# Patient Record
Sex: Female | Born: 2000 | Race: White | Hispanic: No | Marital: Single | Smoking: Never smoker
Health system: Southern US, Community
[De-identification: ages and names within clinical notes are randomized; demographics above are authoritative.]

## PROBLEM LIST (undated history)

## (undated) DIAGNOSIS — F419 Anxiety disorder, unspecified: Secondary | ICD-10-CM

## (undated) HISTORY — DX: Anxiety disorder, unspecified: F41.9

---
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
## 2019-06-13 ENCOUNTER — Other Ambulatory Visit: Payer: Self-pay

## 2019-06-13 DIAGNOSIS — Z20822 Contact with and (suspected) exposure to covid-19: Secondary | ICD-10-CM

## 2019-06-13 DIAGNOSIS — R6889 Other general symptoms and signs: Secondary | ICD-10-CM | POA: Diagnosis not present

## 2019-06-15 LAB — NOVEL CORONAVIRUS, NAA: SARS-CoV-2, NAA: NOT DETECTED

## 2020-02-08 ENCOUNTER — Telehealth: Payer: Self-pay | Admitting: Pediatrics

## 2020-02-08 NOTE — Telephone Encounter (Signed)
Pre-screening for onsite visit  1. Who is bringing the patient to the visit? Self  Informed only one adult can bring patient to the visit to limit possible exposure to COVID19 and facemasks must be worn while in the building by the patient (ages 2 and older) and adult.  2. Has the person bringing the patient or the patient been around anyone with suspected or confirmed COVID-19 in the last 14 days? No   3. Has the person bringing the patient or the patient been around anyone who has been tested for COVID-19 in the last 14 days? No  4. Has the person bringing the patient or the patient had any of these symptoms in the last 14 days? No   Fever (temp 100 F or higher) Breathing problems Cough Sore throat Body aches Chills Vomiting Diarrhea Loss of taste or smell   If all answers are negative, advise patient to call our office prior to your appointment if you or the patient develop any of the symptoms listed above.   If any answers are yes, cancel in-office visit and schedule the patient for a same day telehealth visit with a provider to discuss the next steps.  

## 2020-02-11 ENCOUNTER — Other Ambulatory Visit (HOSPITAL_COMMUNITY)
Admission: RE | Admit: 2020-02-11 | Discharge: 2020-02-11 | Disposition: A | Discharge status: Home / Self Care | Payer: Medicaid Other | Source: Ambulatory Visit | Attending: Pediatrics | Admitting: Pediatrics

## 2020-02-11 ENCOUNTER — Ambulatory Visit (INDEPENDENT_AMBULATORY_CARE_PROVIDER_SITE_OTHER): Payer: Medicaid Other | Admitting: Pediatrics

## 2020-02-11 ENCOUNTER — Other Ambulatory Visit: Payer: Self-pay

## 2020-02-11 ENCOUNTER — Encounter: Payer: Self-pay | Admitting: Pediatrics

## 2020-02-11 VITALS — BP 110/68 | HR 92 | Ht 62.09 in | Wt 129.2 lb

## 2020-02-11 DIAGNOSIS — Z68.41 Body mass index (BMI) pediatric, 5th percentile to less than 85th percentile for age: Secondary | ICD-10-CM | POA: Diagnosis not present

## 2020-02-11 DIAGNOSIS — Z113 Encounter for screening for infections with a predominantly sexual mode of transmission: Secondary | ICD-10-CM

## 2020-02-11 DIAGNOSIS — B351 Tinea unguium: Secondary | ICD-10-CM | POA: Diagnosis not present

## 2020-02-11 DIAGNOSIS — Z0000 Encounter for general adult medical examination without abnormal findings: Secondary | ICD-10-CM

## 2020-02-11 DIAGNOSIS — Z3009 Encounter for other general counseling and advice on contraception: Secondary | ICD-10-CM | POA: Diagnosis not present

## 2020-02-11 LAB — POCT URINALYSIS DIPSTICK
Bilirubin, UA: NORMAL
Blood, UA: NEGATIVE
Glucose, UA: NEGATIVE
Ketones, UA: NEGATIVE
Leukocytes, UA: NEGATIVE
Protein, UA: NEGATIVE
Spec Grav, UA: 1.01 (ref 1.010–1.025)
Urobilinogen, UA: 0.2 E.U./dL
pH, UA: 6 (ref 5.0–8.0)

## 2020-02-11 LAB — CBC WITH DIFFERENTIAL/PLATELET
Absolute Monocytes: 488 cells/uL (ref 200–950)
Basophils Absolute: 18 cells/uL (ref 0–200)
Basophils Relative: 0.3 %
Eosinophils Absolute: 128 cells/uL (ref 15–500)
Eosinophils Relative: 2.1 %
HCT: 37.6 % (ref 35.0–45.0)
Hemoglobin: 12.6 g/dL (ref 11.7–15.5)
Lymphs Abs: 2410 cells/uL (ref 850–3900)
MCH: 30.6 pg (ref 27.0–33.0)
MCHC: 33.5 g/dL (ref 32.0–36.0)
MCV: 91.3 fL (ref 80.0–100.0)
MPV: 11 fL (ref 7.5–12.5)
Monocytes Relative: 8 %
Neutro Abs: 3056 cells/uL (ref 1500–7800)
Neutrophils Relative %: 50.1 %
Platelets: 260 10*3/uL (ref 140–400)
RBC: 4.12 10*6/uL (ref 3.80–5.10)
RDW: 11.8 % (ref 11.0–15.0)
Total Lymphocyte: 39.5 %
WBC: 6.1 10*3/uL (ref 3.8–10.8)

## 2020-02-11 LAB — POCT RAPID HIV: Rapid HIV, POC: NEGATIVE

## 2020-02-11 MED ORDER — TERBINAFINE HCL 250 MG PO TABS: 250.0000 mg | ORAL_TABLET | Freq: Every day | ORAL | 0 refills | Status: DC

## 2020-02-11 NOTE — Progress Notes (Signed)
Adolescent Well Care Visit Eileen Phillips is a 19 y.o. female who is here for well care.    PCP:  Ok Edwards, MD   History was provided by the patient and mother.  Confidentiality was discussed with the patient and, if applicable, with caregiver as well. Patient's personal or confidential phone number: [phone]   Current Issues: Current concerns include:  Chief Complaint  Patient presents with  . Well Child    needs form filled out to start college; vaccine records  . Foot Problem    Left big toe; some kind of fungus for about a year; no pain  . Contraception    mom wants to discuss birth control options   Left toenail discoloration has been ongoing for the past 1 yr. Used some OTC topical meds.  Nutrition: Nutrition/Eating Behaviors: eats a variety of foods Adequate calcium in diet?: yes Supplements/ Vitamins: no  Exercise/ Media: Play any Sports?/ Exercise: no Screen Time:  > 2 hours-counseling provided Media Rules or Monitoring?: no  Sleep:  Sleep: no issues.   Social Screening: Lives with: mom & 3 sibs  Parental relations:  good Activities, Work, and Research officer, political party?: waitresses  Concerns regarding behavior with peers?  no Stressors of note: no  Education: School Name: [employer] . Plans to start [employer] this fall. Major in Database administrator.  School performance: doing well; no concerns   Menstruation:   Patient's last menstrual period was 01/29/2020 (approximate). Menstrual History: regular cycles    Confidential Social History: Tobacco?  no Secondhand smoke exposure?  no Drugs/ETOH?  no  Sexually Active?  No. Has a boyfriend but not planning to become sexually active. Mom however has been discussing birth control with her. Pregnancy Prevention: abstinence  Safe at home, in school & in relationships?  Yes Safe to self?  Yes   Screenings: Patient has a dental home: yes  The patient completed the Rapid Assessment for Adolescent Preventive Services  screening questionnaire and the following topics were identified as risk factors and discussed: healthy eating, exercise, tobacco use, marijuana use, drug use, condom use, mental health issues and screen time    PHQ-9 completed and results indicated: negative screen  Physical Exam:  Vitals:   02/11/20 1447  BP: 110/68  Pulse: 92  Weight: 129 lb 3.2 oz (58.6 kg)  Height: 5' 2.09" (1.577 m)   BP 110/68   Pulse 92   Ht 5' 2.09" (1.577 m)   Wt 129 lb 3.2 oz (58.6 kg)   LMP 01/29/2020 (Approximate)   BMI 23.56 kg/m  Body mass index: body mass index is 23.56 kg/m. Blood pressure percentiles are not available for patients who are 18 years or older.   Hearing Screening   125Hz  250Hz  500Hz  1000Hz  2000Hz  3000Hz  4000Hz  6000Hz  8000Hz   Right ear:   20 20 20  20     Left ear:   20 20 20  20       Visual Acuity Screening   Right eye Left eye Both eyes  Without correction:     With correction: 20/20 20/30 20/25     General Appearance:   alert, oriented, no acute distress  HENT: Normocephalic, no obvious abnormality, conjunctiva clear  Mouth:   Normal appearing teeth, no obvious discoloration, dental caries, or dental caps  Neck:   Supple; thyroid: no enlargement, symmetric, no tenderness/mass/nodules  Chest normal  Lungs:   Clear to auscultation bilaterally, normal work of breathing  Heart:   Regular rate and rhythm, S1 and S2  normal, no murmurs;   Abdomen:   Soft, non-tender, no mass, or organomegaly  GU normal female external genitalia, pelvic not performed  Musculoskeletal:   Tone and strength strong and symmetrical, all extremities               Lymphatic:   No cervical adenopathy  Skin/Hair/Nails:   Skin warm, dry and intact, no rashes, no bruises or petechiae, Left great tow with yellow discoloration  Neurologic:   Strength, gait, and coordination normal and age-appropriate     Assessment and Plan:   19 y/o F for well visit. Onychomycosis Terbinafine 250 mg once daily for  12 weeks.  Detailed discussion regarding contraceptive options. [facility] is leaning toward Nexplanon. Referral placed for adolescent pod for Nexplanon placement.  BMI is appropriate for age  Hearing screening result:normal Vision screening result: normal  Completed paperwork for college. Orders Placed This Encounter  Procedures  . CBC with Differential/Platelet  . QuantiFERON-TB Gold Plus  . Ambulatory referral to Adolescent Medicine  . POCT Rapid HIV  . POCT urinalysis dipstick    Will release lab results to MyChart.  Return in 1 year (on 02/10/2021) for Well child with Dr Wynetta Emery.Marijo File, MD

## 2020-02-11 NOTE — Patient Instructions (Signed)
Websites for Teens  General www.youngwomenshealth.org www.youngmenshealthsite.org www.teenhealthfx.com www.teenhealth.org www.healthychildren.org  Sexual and Reproductive Health www.bedsider.org www.seventeendays.org www.plannedparenthood.org www.sexetc.org www.girlology.com  Relaxation & Meditation Apps for Teens Mindshift StopBreatheThink Relax & Rest Smiling Mind Calm Headspace Take A Chill Kids Feeling SAM Freshmind Yoga By Teens Kids Yogaverse  Websites for kids with ADHD and their families www.smartkidswithld.org www.additudemag.com  Apps for Parents of Teens Thrive KnowBullying  

## 2020-02-12 LAB — URINE CYTOLOGY ANCILLARY ONLY
Chlamydia: NEGATIVE
Comment: NEGATIVE
Comment: NORMAL
Neisseria Gonorrhea: NEGATIVE

## 2020-02-13 ENCOUNTER — Encounter: Payer: Self-pay | Admitting: Pediatrics

## 2020-02-13 LAB — QUANTIFERON-TB GOLD PLUS
Mitogen-NIL: >10 IU/mL
NIL: 0.02 IU/mL
QuantiFERON-TB Gold Plus: NEGATIVE
TB1-NIL: 0 IU/mL
TB2-NIL: 0 IU/mL

## 2020-02-22 ENCOUNTER — Telehealth: Payer: Self-pay | Admitting: Pediatrics

## 2020-02-22 NOTE — Telephone Encounter (Signed)
Pre-screening for onsite visit ° °1. Who is bringing the patient to the visit? Patient  ° °Informed only one adult can bring patient to the visit to limit possible exposure to COVID19 and facemasks must be worn while in the building by the patient (ages 2 and older) and adult. ° °2. Has the person bringing the patient or the patient been around anyone with suspected or confirmed COVID-19 in the last 14 days? No ° °3. Has the person bringing the patient or the patient been around anyone who has been tested for COVID-19 in the last 14 days? {No ° °4. Has the person bringing the patient or the patient had any of these symptoms in the last 14 days? No  ° °Fever (temp 100 F or higher) °Breathing problems °Cough °Sore throat °Body aches °Chills °Vomiting °Diarrhea °Loss of taste or smell ° ° °If all answers are negative, advise patient to call our office prior to your appointment if you or the patient develop any of the symptoms listed above. °  °If any answers are yes, cancel in-office visit and schedule the patient for a same day telehealth visit with a provider to discuss the next steps. °

## 2020-02-25 ENCOUNTER — Ambulatory Visit (INDEPENDENT_AMBULATORY_CARE_PROVIDER_SITE_OTHER): Payer: Medicaid Other | Admitting: Pediatrics

## 2020-02-25 ENCOUNTER — Other Ambulatory Visit: Payer: Self-pay

## 2020-02-25 ENCOUNTER — Encounter: Payer: Self-pay | Admitting: Pediatrics

## 2020-02-25 ENCOUNTER — Other Ambulatory Visit (HOSPITAL_COMMUNITY)
Admission: RE | Admit: 2020-02-25 | Discharge: 2020-02-25 | Disposition: A | Discharge status: Home / Self Care | Payer: Medicaid Other | Source: Ambulatory Visit | Attending: Pediatrics | Admitting: Pediatrics

## 2020-02-25 VITALS — BP 112/73 | HR 76 | Ht 62.3 in | Wt 128.5 lb

## 2020-02-25 DIAGNOSIS — Z113 Encounter for screening for infections with a predominantly sexual mode of transmission: Secondary | ICD-10-CM | POA: Insufficient documentation

## 2020-02-25 DIAGNOSIS — Z30017 Encounter for initial prescription of implantable subdermal contraceptive: Secondary | ICD-10-CM | POA: Diagnosis not present

## 2020-02-25 DIAGNOSIS — Z975 Presence of (intrauterine) contraceptive device: Secondary | ICD-10-CM

## 2020-02-25 NOTE — Progress Notes (Signed)
History was provided by the patient and mother.  Eileen Phillips is a 19 y.o. female who is here for nexplanon placement.   PCP confirmed? Yes.    Marijo File, MD  HPI:   Eileen Phillips was seen by her PCP 2 weeks ago for a well visit and expressed interest in starting birth control, discussed the various options with Dr. Wynetta Emery and would like to move forward with nexplanon placement. Denies current or prior sexual activity but does have a boyfriend and would like to be on a form of birth control just in case.   Eileen Phillips states that her periods occur regularly once per month, bleeding lasts ~5 days. LMP was ~3 weeks ago. No contraindications for placement are present including liver disease, unexplained vaginal bleeding, history of breast cancer, or history of blood clots.  ROS: denies recent fevers, cough, congestion  Patient Active Problem List   Diagnosis Date Noted  . Weight loss 06/05/2018  . Failed vision screen 06/05/2018  . h/o Headaches 04/13/2016    Current Outpatient Medications on File Prior to Visit  Medication Sig Dispense Refill  . albuterol (PROVENTIL HFA;VENTOLIN HFA) 108 (90 Base) MCG/ACT inhaler Inhale 2 puffs into the lungs every 6 (six) hours as needed for wheezing or shortness of breath. 1 Inhaler 2  . terbinafine (LAMISIL) 250 MG tablet Take 1 tablet (250 mg total) by mouth daily. 84 tablet 0   No current facility-administered medications on file prior to visit.    Allergies  Allergen Reactions  . Zithromax [Azithromycin]     Physical Exam:    Vitals:   02/25/20 1334  BP: 112/73  Pulse: 76  Weight: 128 lb 8 oz (58.3 kg)  Height: 5' 2.3" (1.582 m)    Blood pressure percentiles are not available for patients who are 18 years or older. Patient's last menstrual period was 01/29/2020 (approximate).  Physical Exam Constitutional:      General: She is not in acute distress.    Appearance: She is not toxic-appearing.  HENT:     Head: Normocephalic.   Nose: Nose normal.  Eyes:     Conjunctiva/sclera: Conjunctivae normal.  Pulmonary:     Effort: Pulmonary effort is normal. No respiratory distress.  Musculoskeletal:     Cervical back: Normal range of motion.  Skin:    General: Skin is warm and dry.  Neurological:     General: No focal deficit present.     Mental Status: She is alert and oriented to person, place, and time.      Assessment/Plan: 19 year old female presenting for nexplanon placement. No contraindications identified. Risks and benefits discussed and patient verbalized understanding, consent form signed. Procedure was performed without complication and tolerated well. Scheduled follow up in 3 months, advised patient to call sooner if needed should she experience any difficulty with irregular menstrual cycles or breakthrough bleeding. - Virtual follow up in 3 months

## 2020-02-25 NOTE — Patient Instructions (Signed)
° °  Congratulations on getting your Nexplanon placement!  Below is some important information about Nexplanon. ° °First remember that Nexplanon does not prevent sexually transmitted infections.  Condoms will help prevent sexually transmitted infections. °The Nexplanon starts working 7 days after it was inserted.  There is a risk of getting pregnant if you have unprotected sex in those first 7 days after placement of the Nexplanon. ° °The Nexplanon lasts for 3 years but can be removed at any time.  You can become pregnant as early as 1 week after removal.  You can have a new Nexplanon put in after the old one is removed if you like. ° °It is not known whether Nexplanon is as effective in women who are very overweight because the studies did not include many overweight women. ° °Nexplanon interacts with some medications, including barbiturates, bosentan, carbamazepine, felbamate, griseofulvin, oxcarbazepine, phenytoin, rifampin, St. John's wort, topiramate, HIV medicines.  Please alert your doctor if you are on any of these medicines. ° °Always tell other healthcare providers that you have a Nexplanon in your arm. ° °The Nexplanon was placed just under the skin.  Leave the outside bandage on for 24 hours.  Leave the smaller bandage on for 3-5 days or until it falls off on its own.  Keep the area clean and dry for 3-5 days. °There is usually bruising or swelling at the insertion site for a few days to a week after placement.  If you see redness or pus draining from the insertion site, call us immediately. ° °Keep your user card with the date the implant was placed and the date the implant is to be removed. ° °The most common side effect is a change in your menstrual bleeding pattern.   This bleeding is generally not harmful to you but can be annoying.  Call or come in to see us if you have any concerns about the bleeding or if you have any side effects or questions.   ° °We will call you in 1 week to check in and we  would like you to return to the clinic for a follow-up visit in 1 month. ° °You can call [facility] 24 hours a day with any questions or concerns.  There is always a nurse or doctor available to take your call.  Call 9-1-1 if you have a life-threatening emergency.  For anything else, please call us at [phone] before heading to the ER. °

## 2020-02-26 LAB — URINE CYTOLOGY ANCILLARY ONLY
Chlamydia: NEGATIVE
Comment: NEGATIVE
Comment: NORMAL
Neisseria Gonorrhea: NEGATIVE

## 2020-02-27 DIAGNOSIS — Z975 Presence of (intrauterine) contraceptive device: Secondary | ICD-10-CM | POA: Insufficient documentation

## 2020-02-27 MED ORDER — ETONOGESTREL 68 MG ~~LOC~~ IMPL
68.0000 mg | DRUG_IMPLANT | Freq: Once | SUBCUTANEOUS | Status: AC
  Administered 2020-02-25: 68 mg via SUBCUTANEOUS

## 2020-02-27 NOTE — Progress Notes (Signed)
Nexplanon Insertion  No contraindications for placement.  No liver disease, no unexplained vaginal bleeding, no h/o breast cancer, no h/o blood clots.  Patient's last menstrual period was 01/29/2020 (approximate).  UHCG: negative   Last Unprotected sex:  None   Risks & benefits of Nexplanon discussed The nexplanon device was purchased and supplied by [facility]. Packaging instructions supplied to patient Consent form signed  The patient denies any allergies to anesthetics or antiseptics.  Procedure: Pt was placed in supine position. The right arm was flexed at the elbow and externally rotated so that her wrist was parallel to her ear The medial epicondyle of the right arm was identified The insertions site was marked 8 cm proximal to the medial epicondyle The insertion site was cleaned with Betadine The area surrounding the insertion site was covered with a sterile drape 1% lidocaine was injected just under the skin at the insertion site extending 4 cm proximally. The sterile preloaded disposable Nexaplanon applicator was removed from the sterile packaging The applicator needle was inserted at a 30 degree angle at 8 cm proximal to the medial epicondyle as marked The applicator was lowered to a horizontal position and advanced just under the skin for the full length of the needle The slider on the applicator was retracted fully while the applicator remained in the same position, then the applicator was removed. The implant was confirmed via palpation as being in position The implant position was demonstrated to the patient Pressure dressing was applied to the patient.  The patient was instructed to removed the pressure dressing in 24 hrs.  The patient was advised to move slowly from a supine to an upright position  The patient denied any concerns or complaints  The patient was instructed to schedule a follow-up appt in 1 month and to call sooner if any concerns.  The patient  acknowledged agreement and understanding of the plan.

## 2020-05-27 ENCOUNTER — Telehealth: Payer: Medicaid Other | Admitting: Pediatrics

## 2020-11-03 ENCOUNTER — Ambulatory Visit
Admission: EM | Admit: 2020-11-03 | Discharge: 2020-11-03 | Disposition: A | Discharge status: Home / Self Care | Payer: Managed Care, Other (non HMO) | Attending: Internal Medicine | Admitting: Internal Medicine

## 2020-11-03 DIAGNOSIS — K0889 Other specified disorders of teeth and supporting structures: Secondary | ICD-10-CM | POA: Diagnosis not present

## 2020-11-03 MED ORDER — IBUPROFEN 600 MG PO TABS: 600.0000 mg | ORAL_TABLET | Freq: Four times a day (QID) | ORAL | 0 refills | Status: DC | PRN

## 2020-11-03 MED ORDER — AMOXICILLIN-POT CLAVULANATE 875-125 MG PO TABS: 1.0000 | ORAL_TABLET | Freq: Two times a day (BID) | ORAL | 0 refills | Status: DC

## 2020-11-03 NOTE — Discharge Instructions (Signed)
Take medications as directed If pain persist please return to your dentist for evaluation.

## 2020-11-03 NOTE — ED Provider Notes (Signed)
[facility]    CSN: [id number] Arrival date & time: 11/03/20  1847      History   Chief Complaint No chief complaint on file.   HPI Eileen Phillips is a 19 y.o. female comes to the urgent care with 2-day history of left-sided jaw pain and painful gum swelling.  Patient had wisdom teeth extraction in October 2021.  According to the patient the distraction was without complication.  She started experiencing throbbing pain in the left jaw couple of days ago.  Pain is of moderate severity currently 6 out of 10, aggravated by biting down on the teeth and chewing.  She has not tried any over-the-counter medications.  She has endorses swelling of the gum in the area of the pain.  No discharge noted.Marland Kitchen   HPI  History reviewed. No pertinent past medical history.  Patient Active Problem List   Diagnosis Date Noted   Nexplanon in place 02/27/2020   Weight loss 06/05/2018   Failed vision screen 06/05/2018   h/o Headaches 04/13/2016    History reviewed. No pertinent surgical history.  OB History   No obstetric history on file.      Home Medications    Prior to Admission medications   Medication Sig Start Date End Date Taking? Authorizing Provider  amoxicillin-clavulanate (AUGMENTIN) 875-125 MG tablet Take 1 tablet by mouth every 12 (twelve) hours. 11/03/20  Yes Kayleanna Lorman, Britta Mccreedy, MD  ibuprofen (ADVIL) 600 MG tablet Take 1 tablet (600 mg total) by mouth every 6 (six) hours as needed. 11/03/20  Yes Sherina Stammer, Britta Mccreedy, MD  albuterol (PROVENTIL HFA;VENTOLIN HFA) 108 (90 Base) MCG/ACT inhaler Inhale 2 puffs into the lungs every 6 (six) hours as needed for wheezing or shortness of breath. 03/01/18   Jonetta Osgood, MD  etonogestrel (NEXPLANON) 68 MG IMPL implant 1 each (68 mg total) by Subdermal route once.    Verneda Skill, FNP  terbinafine (LAMISIL) 250 MG tablet Take 1 tablet (250 mg total) by mouth daily. 02/11/20   Marijo File, MD    Family History History  reviewed. No pertinent family history.  Social History Social History   Tobacco Use   Smoking status: Never Smoker   Smokeless tobacco: Never Used   Tobacco comment: mom smokes      Allergies   Zithromax [azithromycin]   Review of Systems Review of Systems  HENT: Positive for dental problem.   Respiratory: Negative.   Cardiovascular: Negative.      Physical Exam Triage Vital Signs ED Triage Vitals  Enc Vitals Group     BP 11/03/20 1853 113/74     Pulse Rate 11/03/20 1853 81     Resp 11/03/20 1853 18     Temp 11/03/20 1853 99.8 F (37.7 C)     Temp src --      SpO2 11/03/20 1853 98 %     Weight --      Swelling --      Head Circumference --      Peak Flow --      Pain Score 11/03/20 1855 6     Pain Loc --      Pain Edu? --      Excl. in GC? --    No data found.  Updated Vital Signs BP 113/74    Pulse 81    Temp 99.8 F (37.7 C)    Resp 18    LMP  (LMP Unknown)    SpO2 98%   Visual  Acuity Right Eye Distance:   Left Eye Distance:   Bilateral Distance:    Right Eye Near:   Left Eye Near:    Bilateral Near:     Physical Exam Vitals and nursing note reviewed.  Constitutional:      General: She is in acute distress.     Appearance: She is not ill-appearing.  HENT:     Mouth/Throat:     Pharynx: No posterior oropharyngeal erythema.     Comments: Tenderness and mild swelling in the area where the 3rd left mandibular molar would be.No discharge. Neurological:     Mental Status: She is alert.      UC Treatments / Results  Labs (all labs ordered are listed, but only abnormal results are displayed) Labs Reviewed - No data to display  EKG   Radiology No results found.  Procedures Procedures (including critical care time)  Medications Ordered in UC Medications - No data to display  Initial Impression / Assessment and Plan / UC Course  I have reviewed the triage vital signs and the nursing notes.  Pertinent labs & imaging results that  were available during my care of the patient were reviewed by me and considered in my medical decision making (see chart for details).    1.  Dental pain: I will give the patient a short course of antibiotics-Augmentin Ibuprofen as needed for pain If pain is persistent or if swelling worsens patient is advised to reach out to the dentist to be evaluated. Final Clinical Impressions(s) / UC Diagnoses   Final diagnoses:  Pain, dental     Discharge Instructions     Take medications as directed If pain persist please return to your dentist for evaluation.   ED Prescriptions    Medication Sig Dispense Auth. Provider   amoxicillin-clavulanate (AUGMENTIN) 875-125 MG tablet Take 1 tablet by mouth every 12 (twelve) hours. 14 tablet [place] Talcott, Britta Mccreedy, MD   ibuprofen (ADVIL) 600 MG tablet Take 1 tablet (600 mg total) by mouth every 6 (six) hours as needed. 30 tablet Jossiah Smoak, Britta Mccreedy, MD     PDMP not reviewed this encounter.   Merrilee Jansky, MD 11/03/20 [phone]

## 2020-11-03 NOTE — ED Triage Notes (Signed)
Pt presents with left side jaw pain that began a couple days ago, pt had wisdom tooth extraction in October, concerned for infection

## 2021-04-13 ENCOUNTER — Ambulatory Visit: Payer: Medicaid Other | Admitting: Pediatrics

## 2023-02-23 ENCOUNTER — Ambulatory Visit: Payer: Managed Care, Other (non HMO) | Admitting: Nurse Practitioner

## 2023-04-06 ENCOUNTER — Ambulatory Visit: Payer: Managed Care, Other (non HMO) | Admitting: Obstetrics & Gynecology

## 2023-04-06 ENCOUNTER — Encounter: Payer: Self-pay | Admitting: Obstetrics & Gynecology

## 2023-04-06 ENCOUNTER — Other Ambulatory Visit (HOSPITAL_COMMUNITY)
Admission: RE | Admit: 2023-04-06 | Discharge: 2023-04-06 | Disposition: A | Discharge status: Home / Self Care | Payer: Managed Care, Other (non HMO) | Source: Ambulatory Visit | Attending: Obstetrics & Gynecology | Admitting: Obstetrics & Gynecology

## 2023-04-06 VITALS — BP 128/79 | HR 71 | Ht 61.0 in | Wt 162.0 lb

## 2023-04-06 DIAGNOSIS — Z975 Presence of (intrauterine) contraceptive device: Secondary | ICD-10-CM

## 2023-04-06 DIAGNOSIS — Z01419 Encounter for gynecological examination (general) (routine) without abnormal findings: Secondary | ICD-10-CM

## 2023-04-06 DIAGNOSIS — Z124 Encounter for screening for malignant neoplasm of cervix: Secondary | ICD-10-CM | POA: Insufficient documentation

## 2023-04-06 NOTE — Progress Notes (Signed)
   WELL-WOMAN EXAMINATION Patient name: Eileen Phillips MRN [id number]  Date of birth: 07/21/2001 Chief Complaint:   Gynecologic Exam  History of Present Illness:   Eileen Phillips is a 22 y.o. G0P0000 female being seen today for a routine well-woman exam.   Nexplanon- April 2021  Notes intermittent bleeding- moderate to light bleeding-overall happy with Nexplanon reports no acute complaints  No LMP recorded. Patient has had an implant. Denies issues with her menses The current method of family planning is Nexplanon.    Last pap never.  Last mammogram: N/A. Last colonoscopy: N/A     06/05/2018   11:20 AM  Depression screen PHQ 2/9  Decreased Interest 0  Down, Depressed, Hopeless 0  PHQ - 2 Score 0  Altered sleeping 1  Tired, decreased energy 0  Change in appetite 0  Feeling bad or failure about yourself  0  Trouble concentrating 0  Moving slowly or fidgety/restless 0  PHQ-9 Score 1      Review of Systems:   Pertinent items are noted in HPI Denies any headaches, blurred vision, fatigue, shortness of breath, chest pain, abdominal pain, bowel movements, urination, or intercourse unless otherwise stated above.  Pertinent History Reviewed:  Reviewed past medical,surgical, social and family history.  Reviewed problem list, medications and allergies. Physical Assessment:   Vitals:   04/06/23 0935  BP: 128/79  Pulse: 71  Weight: 162 lb (73.5 kg)  Height: 5\' 1"  (1.549 m)  Body mass index is 30.61 kg/m.        Physical Examination:   General appearance - well appearing, and in no distress  Mental status - alert, oriented to person, place, and time  Psych:  She has a normal mood and affect  Skin - warm and dry, normal color, no suspicious lesions noted  Chest - effort normal, all lung fields clear to auscultation bilaterally  Heart - normal rate and regular rhythm  Neck:  midline trachea, no thyromegaly or nodules  Breasts - breasts appear normal, no suspicious  masses, no skin or nipple changes or  axillary nodes  Abdomen - soft, nontender, nondistended, no masses or organomegaly  Pelvic - VULVA: normal appearing vulva with no masses, tenderness or lesions  VAGINA: normal appearing vagina with normal color and discharge, no lesions  CERVIX: normal appearing cervix without discharge or lesions, no CMT  Thin prep pap is done with HR HPV cotesting  UTERUS: uterus is felt to be normal size, shape, consistency and nontender   ADNEXA: No adnexal masses or tenderness noted.  Extremities:  No swelling or varicosities noted.  Right arm with palpable device  Chaperone: Faith Rogue     Assessment & Plan:  1) Well-Woman Exam -Pap collected -Declined blood work for STI screening  2) contraceptive management -Discussed Nexplanon effective for up to 5 years, typically recommend replacement at 4 years []  Plan to replace April 2025  No orders of the defined types were placed in this encounter.   Meds: No orders of the defined types were placed in this encounter.   Follow-up: Return in about 1 year (around 04/05/2024) for Annual.   Myna Hidalgo, DO Attending Obstetrician & Gynecologist, [facility], [facility]

## 2023-04-12 ENCOUNTER — Other Ambulatory Visit: Payer: Self-pay | Admitting: Obstetrics & Gynecology

## 2023-04-12 DIAGNOSIS — B9689 Other specified bacterial agents as the cause of diseases classified elsewhere: Secondary | ICD-10-CM

## 2023-04-12 LAB — CYTOLOGY - PAP
Adequacy: ABSENT
Chlamydia: NEGATIVE
Comment: NEGATIVE
Comment: NORMAL
Diagnosis: NEGATIVE
Neisseria Gonorrhea: NEGATIVE

## 2023-04-12 MED ORDER — METRONIDAZOLE 500 MG PO TABS: 500.0000 mg | ORAL_TABLET | Freq: Two times a day (BID) | ORAL | 0 refills | Status: AC

## 2023-04-12 NOTE — Progress Notes (Signed)
Prescription for BV.

## 2024-04-06 ENCOUNTER — Other Ambulatory Visit

## 2024-04-11 ENCOUNTER — Ambulatory Visit: Admitting: Obstetrics & Gynecology

## 2024-04-11 ENCOUNTER — Ambulatory Visit (INDEPENDENT_AMBULATORY_CARE_PROVIDER_SITE_OTHER)

## 2024-04-11 ENCOUNTER — Encounter: Payer: Self-pay | Admitting: Obstetrics & Gynecology

## 2024-04-11 VITALS — BP 122/82 | HR 73 | Ht 62.0 in | Wt 165.8 lb

## 2024-04-11 VITALS — BP 113/71 | HR 80 | Ht 62.0 in | Wt 166.0 lb

## 2024-04-11 DIAGNOSIS — Z0000 Encounter for general adult medical examination without abnormal findings: Secondary | ICD-10-CM

## 2024-04-11 DIAGNOSIS — Z0001 Encounter for general adult medical examination with abnormal findings: Secondary | ICD-10-CM | POA: Diagnosis not present

## 2024-04-11 DIAGNOSIS — Z3046 Encounter for surveillance of implantable subdermal contraceptive: Secondary | ICD-10-CM

## 2024-04-11 DIAGNOSIS — Z30016 Encounter for initial prescription of transdermal patch hormonal contraceptive device: Secondary | ICD-10-CM | POA: Diagnosis not present

## 2024-04-11 DIAGNOSIS — F419 Anxiety disorder, unspecified: Secondary | ICD-10-CM | POA: Diagnosis not present

## 2024-04-11 MED ORDER — SERTRALINE HCL 25 MG PO TABS: 25.0000 mg | ORAL_TABLET | Freq: Every day | ORAL | 3 refills | Status: AC

## 2024-04-11 MED ORDER — NORELGESTROMIN-ETH ESTRADIOL 150-35 MCG/24HR TD PTWK: 1.0000 | MEDICATED_PATCH | TRANSDERMAL | 12 refills | Status: AC

## 2024-04-11 NOTE — Progress Notes (Signed)
   GYN VISIT Patient name: Eileen Phillips MRN [id number]  Date of birth: Oct 26, 2001 Chief Complaint:   Contraception  History of Present Illness:   Eileen Phillips is a 23 y.o. G0P0000 female being seen today for the following:  - Nexplanon  removal; patient had device placed April 2021.  While she has not had any issues with this device, she does not desire replacement.  She denies irregular bleeding, menses were mostly monthly.  Denies heavy menstrual bleeding or dysmenorrhea.  She is interested in reviewing her contraceptive options.  Denies irregular discharge, itching or irritation.  Denies pelvic or abdominal pain.  Reports no acute GYN concerns     No LMP recorded. Patient has had an implant.    Review of Systems:   Pertinent items are noted in HPI Denies fever/chills, dizziness, headaches, visual disturbances, fatigue, shortness of breath, chest pain, abdominal pain, vomiting. Pertinent History Reviewed:  History reviewed. No pertinent surgical history.  Past Medical History:  Diagnosis Date   Anxiety    Reviewed problem list, medications and allergies. Physical Assessment:   Vitals:   04/11/24 0927  BP: 122/82  Pulse: 73  Weight: 165 lb 12.8 oz (75.2 kg)  Height: 5\' 2"  (1.575 m)  Body mass index is 30.33 kg/m.       Physical Examination:   General appearance: alert, well appearing, and in no distress  Psych: mood appropriate, normal affect  Skin: warm & dry   Cardiovascular: normal heart rate noted  Respiratory: normal respiratory effort, no distress  Extremities: left arm with palpable device  Chaperone: N/A      NEXPLANON  REMOVAL    Risks/benefits/side effects of Nexplanon  have been discussed and her questions have been answered.  Specifically, a failure rate of 11/998 has been reported, with an increased failure rate if pt takes St. John's Wort and/or antiseizure medicaitons.  She is aware of the common side effect of irregular bleeding, which the  incidence of decreases over time. Signed copy of informed consent in chart.    Nexplanon  site identified.  Area prepped in usual sterile fashon. Two cc's of 2% lidocaine was used to anesthetize the area. A small stab incision was made right beside the implant on the distal portion.  The Nexplanon  rod was grasped using hemostats and removed intact without difficulty.  Steri-strips and a pressure bandage was applied.  There was less than 3 cc blood loss. There were no complications.  The patient tolerated the procedure well.  A/P: - Nexplanon  removed without difficulty as above -Contraceptive management -reviewed all contraceptive options including pills, patch, ring, Depot or LARCs -risk/benefit and potential side effects of each were reviewed -questions and concerns were addressed, pt desires to proceed with patch  OCP risk assessment: Pt denies personal history of VTE, stroke or heart attack.  Denies personal h/o breast cancer.  Pt is either a non-smoker or smoker under the age of 24yo.  Denies h/o migraines with aura   Meds ordered this encounter  Medications   norelgestromin-ethinyl estradiol (XULANE) 150-35 MCG/24HR transdermal patch    Sig: Place 1 patch onto the skin once a week. After 3 weeks, do not apply a patch for one week then restart the process.    Dispense:  3 patch    Refill:  12      Return for May 2026 annual.   Paraskevi Funez, DO Attending Obstetrician & Gynecologist, [facility] for [facility], [facility]  '

## 2024-04-11 NOTE — Progress Notes (Unsigned)
 Established Patient Office Visit  Subjective   Patient ID: Eileen Phillips, female    DOB: Sep 08, 2001  Age: 23 y.o. MRN: [id number]  Chief Complaint  Patient presents with   Establish Care    Pt here to establish care, Also anxiety,Allergies and an annual     HPI Patient is here to establish care.  She reports that she has been experiencing an increase in anxiety where she does not want to leave the house or participate in social activities.  She does endorse family history of anxiety in her mother.  Of note she is having Nexplanon  removed by OB/GYN later today. She works as a Banker office Patient Active Problem List   Diagnosis Date Noted   Anxiety 04/12/2024   Nexplanon  in place 02/27/2020   Weight loss 06/05/2018   Failed vision screen 06/05/2018   h/o Headaches 04/13/2016   Past Medical History:  Diagnosis Date   Anxiety       Review of Systems  Constitutional: Negative.   HENT: Negative.    Eyes: Negative.   Respiratory: Negative.    Cardiovascular: Negative.   Gastrointestinal: Negative.   Genitourinary: Negative.   Musculoskeletal: Negative.   Skin: Negative.   Neurological: Negative.   Psychiatric/Behavioral:  Negative for depression, substance abuse and suicidal ideas. The patient is nervous/anxious. The patient does not have insomnia.       Objective:     BP 113/71   Pulse 80   Ht 5\' 2"  (1.575 m)   Wt 166 lb (75.3 kg)   SpO2 98%   BMI 30.36 kg/m  BP Readings from Last 3 Encounters:  04/11/24 122/82  04/11/24 113/71  04/06/23 128/79   Wt Readings from Last 3 Encounters:  04/11/24 165 lb 12.8 oz (75.2 kg)  04/11/24 166 lb (75.3 kg)  04/06/23 162 lb (73.5 kg)     Physical Exam Vitals and nursing note reviewed.  Constitutional:      Appearance: Normal appearance.  HENT:     Head: Normocephalic.     Right Ear: Tympanic membrane, ear canal and external ear normal.     Left Ear: Tympanic membrane, ear canal and external  ear normal.     Nose: Nose normal.     Mouth/Throat:     Mouth: Mucous membranes are moist.     Pharynx: Oropharynx is clear.  Cardiovascular:     Rate and Rhythm: Normal rate and regular rhythm.  Pulmonary:     Effort: Pulmonary effort is normal.     Breath sounds: Normal breath sounds.  Musculoskeletal:     Cervical back: Normal range of motion and neck supple.  Skin:    General: Skin is warm and dry.  Neurological:     Mental Status: She is alert and oriented to person, place, and time.  Psychiatric:        Mood and Affect: Mood normal.        Thought Content: Thought content normal.      No results found for any visits on 04/11/24.  Last CBC Lab Results  Component Value Date   WBC 6.1 02/11/2020   HGB 12.6 02/11/2020   HCT 37.6 02/11/2020   MCV 91.3 02/11/2020   MCH 30.6 02/11/2020   RDW 11.8 02/11/2020   PLT 260 02/11/2020   Last metabolic panel Lab Results  Component Value Date   GLUCOSE 81 03/23/2016   NA 139 03/23/2016   K 4.3 03/23/2016   CL 104 03/23/2016  CO2 25 03/23/2016   BUN 15 03/23/2016   CREATININE 1.10 (H) 03/23/2016   CALCIUM 9.5 03/23/2016   PROT 7.3 03/23/2016   ALBUMIN 4.7 03/23/2016   BILITOT 0.3 03/23/2016   ALKPHOS 48 03/23/2016   AST 16 03/23/2016   ALT 10 03/23/2016   Last lipids Lab Results  Component Value Date   CHOL 153 03/23/2016   HDL 46 03/23/2016   LDLCALC 92 03/23/2016   TRIG 74 03/23/2016   CHOLHDL 3.3 03/23/2016   Last hemoglobin A1c No results found for: "HGBA1C" Last thyroid functions No results found for: "TSH", "T3TOTAL", "T4TOTAL", "THYROIDAB"    The ASCVD Risk score (Arnett DK, et al., 2019) failed to calculate for the following reasons:   The 2019 ASCVD risk score is only valid for ages 73 to 38    Assessment & Plan:   Problem List Items Addressed This Visit       Other   Anxiety   Add Zoloft for ongoing anxiety.  Advised to watch for worsening of anxiety and/or depression.  She denies any  SI recommend follow-up in 2 to 3 months or sooner if needed.      Relevant Medications   sertraline (ZOLOFT) 25 MG tablet   Other Visit Diagnoses       Encounter for preventative adult health care examination    -  Primary   Unremarkable exam in office today.  We will check fasting labs to obtain baseline.  Follow-up according to lab results.   Relevant Orders   CMP14+EGFR   Lipid Profile   HgB A1c   TSH + free T4   CBC       No follow-ups on file.    Alison Irvine, FNP

## 2024-04-12 DIAGNOSIS — F419 Anxiety disorder, unspecified: Secondary | ICD-10-CM | POA: Insufficient documentation

## 2024-04-12 NOTE — Assessment & Plan Note (Signed)
 Add Zoloft for ongoing anxiety.  Advised to watch for worsening of anxiety and/or depression.  She denies any SI recommend follow-up in 2 to 3 months or sooner if needed.

## 2024-04-26 LAB — CBC
Hematocrit: 39.6 % (ref 34.0–46.6)
Hemoglobin: 13 g/dL (ref 11.1–15.9)
MCH: 29.9 pg (ref 26.6–33.0)
MCHC: 32.8 g/dL (ref 31.5–35.7)
MCV: 91 fL (ref 79–97)
Platelets: 259 10*3/uL (ref 150–450)
RBC: 4.35 x10E6/uL (ref 3.77–5.28)
RDW: 11.7 % (ref 11.7–15.4)
WBC: 5.1 10*3/uL (ref 3.4–10.8)

## 2024-04-26 LAB — LIPID PANEL
Chol/HDL Ratio: 4.2 ratio (ref 0.0–4.4)
Cholesterol, Total: 202 mg/dL — ABNORMAL HIGH (ref 100–199)
HDL: 48 mg/dL (ref >39)
LDL Chol Calc (NIH): 131 mg/dL — ABNORMAL HIGH (ref 0–99)
Triglycerides: 127 mg/dL (ref 0–149)
VLDL Cholesterol Cal: 23 mg/dL (ref 5–40)

## 2024-04-26 LAB — CMP14+EGFR
ALT: 11 IU/L (ref 0–32)
AST: 16 IU/L (ref 0–40)
Albumin: 4.3 g/dL (ref 4.0–5.0)
Alkaline Phosphatase: 36 IU/L — ABNORMAL LOW (ref 44–121)
BUN/Creatinine Ratio: 12 (ref 9–23)
BUN: 10 mg/dL (ref 6–20)
Bilirubin Total: 0.3 mg/dL (ref 0.0–1.2)
CO2: 18 mmol/L — ABNORMAL LOW (ref 20–29)
Calcium: 9.6 mg/dL (ref 8.7–10.2)
Chloride: 104 mmol/L (ref 96–106)
Creatinine, Ser: 0.82 mg/dL (ref 0.57–1.00)
Globulin, Total: 2.6 g/dL (ref 1.5–4.5)
Glucose: 84 mg/dL (ref 70–99)
Potassium: 4.4 mmol/L (ref 3.5–5.2)
Sodium: 140 mmol/L (ref 134–144)
Total Protein: 6.9 g/dL (ref 6.0–8.5)
eGFR: 103 mL/min/{1.73_m2} (ref >59)

## 2024-04-26 LAB — HEMOGLOBIN A1C
Est. average glucose Bld gHb Est-mCnc: 105 mg/dL
Hgb A1c MFr Bld: 5.3 % (ref 4.8–5.6)

## 2024-04-26 LAB — TSH+FREE T4
Free T4: 1.15 ng/dL (ref 0.82–1.77)
TSH: 2.86 u[IU]/mL (ref 0.450–4.500)

## 2024-05-07 ENCOUNTER — Ambulatory Visit: Payer: Self-pay
